# Patient Record
Sex: Male | Born: 1980 | Race: Black or African American | Hispanic: No | Marital: Single | Smoking: Current every day smoker
Health system: Southern US, Community
[De-identification: ages and names within clinical notes are randomized; demographics above are authoritative.]

---

## 2008-09-27 ENCOUNTER — Emergency Department (HOSPITAL_COMMUNITY): Admission: EM | Admit: 2008-09-27 | Discharge: 2008-09-27 | Payer: Self-pay | Admitting: Family Medicine

## 2014-04-22 ENCOUNTER — Encounter (HOSPITAL_COMMUNITY): Payer: Self-pay | Admitting: *Deleted

## 2014-04-22 ENCOUNTER — Emergency Department (HOSPITAL_COMMUNITY)
Admission: EM | Admit: 2014-04-22 | Discharge: 2014-04-22 | Disposition: A | Discharge status: Home / Self Care | Payer: Self-pay | Attending: Emergency Medicine | Admitting: Emergency Medicine

## 2014-04-22 ENCOUNTER — Emergency Department (HOSPITAL_COMMUNITY): Payer: Self-pay

## 2014-04-22 DIAGNOSIS — S82435A Nondisplaced oblique fracture of shaft of left fibula, initial encounter for closed fracture: Secondary | ICD-10-CM | POA: Insufficient documentation

## 2014-04-22 DIAGNOSIS — M25572 Pain in left ankle and joints of left foot: Secondary | ICD-10-CM

## 2014-04-22 DIAGNOSIS — S99912A Unspecified injury of left ankle, initial encounter: Secondary | ICD-10-CM

## 2014-04-22 DIAGNOSIS — Y9389 Activity, other specified: Secondary | ICD-10-CM | POA: Insufficient documentation

## 2014-04-22 DIAGNOSIS — Y998 Other external cause status: Secondary | ICD-10-CM | POA: Insufficient documentation

## 2014-04-22 DIAGNOSIS — Y9289 Other specified places as the place of occurrence of the external cause: Secondary | ICD-10-CM | POA: Insufficient documentation

## 2014-04-22 DIAGNOSIS — Z720 Tobacco use: Secondary | ICD-10-CM | POA: Insufficient documentation

## 2014-04-22 DIAGNOSIS — W1849XA Other slipping, tripping and stumbling without falling, initial encounter: Secondary | ICD-10-CM | POA: Insufficient documentation

## 2014-04-22 DIAGNOSIS — S82402A Unspecified fracture of shaft of left fibula, initial encounter for closed fracture: Secondary | ICD-10-CM

## 2014-04-22 DIAGNOSIS — M25562 Pain in left knee: Secondary | ICD-10-CM

## 2014-04-22 DIAGNOSIS — M25472 Effusion, left ankle: Secondary | ICD-10-CM

## 2014-04-22 DIAGNOSIS — W19XXXA Unspecified fall, initial encounter: Secondary | ICD-10-CM

## 2014-04-22 MED ORDER — OXYCODONE-ACETAMINOPHEN 5-325 MG PO TABS
2.0000 | ORAL_TABLET | Freq: Once | ORAL | Status: AC
Start: 2014-04-22 — End: 2014-04-22
  Administered 2014-04-22: 2 via ORAL
  Filled 2014-04-22: qty 2

## 2014-04-22 MED ORDER — OXYCODONE-ACETAMINOPHEN 10-325 MG PO TABS: 1.0000 | ORAL_TABLET | ORAL | Status: AC | PRN
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
## 2014-04-22 NOTE — Discharge Instructions (Signed)
1. Medications: peercocet, usual home medications 2. Treatment: rest, drink plenty of fluids, wear brace, no walking on your left leg 3. Follow Up: Please followup with orthopedics within 3 days for further evaluation of your leg; the resource guide to find a primary care physician do not have one   Fibular Fracture, Ankle, Adult, Treated With or Without Immobilization A fibular fracture at your ankle is a break (fracture) bone in the smallest of the two bones in your lower leg, located on the outside of your leg (fibula) close to the area at your ankle joint. CAUSES  Rolling your ankle.  Twisting your ankle.  Extreme flexing or extending of your foot.  Severe force on your ankle as when falling from a distance. RISK FACTORS  Jumping activities.  Participation in sports.  Osteoporosis.  Advanced age.  Previous ankle injuries. SIGNS AND SYMPTOMS  Pain.  Swelling.  Inability to put weight on injured ankle.  Bruising.  Bone deformities at site of injury. DIAGNOSIS  This fracture is diagnosed with the help of an X-ray exam. TREATMENT  If the fractured bone did not move out of place it usually will heal without problems and does casting or splinting. If immobilization is needed for comfort or the fractured bone moved out of place and will not heal properly with immobilization, a cast or splint will be used. HOME CARE INSTRUCTIONS   Apply ice to the area of injury:  Put ice in a plastic bag.  Place a towel between your skin and the bag.  Leave the ice on for 20 minutes, 2-3 times a day.  Use crutches as directed. Resume walking without crutches as directed by your health care provider.  Only take over-the-counter or prescription medicines for pain, discomfort, or fever as directed by your health care provider.  If you have a removable splint or boot, do not remove the boot unless directed by your health care provider. SEEK MEDICAL CARE IF:   You have continued  pain or more swelling  The medications do not control the pain. SEEK IMMEDIATE MEDICAL CARE IF:  You develop severe pain in the leg or foot.  Your skin or nails below the injury turn blue or grey or feel cold or numb. MAKE SURE YOU:   Understand these instructions.  Will watch your condition.  Will get help right away if you are not doing well or get worse. Document Released: 05/23/2005 Document Revised: 03/13/2013 Document Reviewed: 01/02/2013 [facility] Patient Information 2015 [employer], [place]. This information is not intended to replace advice given to you by your health care provider. Make sure you discuss any questions you have with your health care provider.    Emergency Department Resource Guide 1) Find a Doctor and Pay Out of Pocket Although you won't have to find out who is covered by your insurance plan, it is a good idea to ask around and get recommendations. You will then need to call the office and see if the doctor you have chosen will accept you as a new patient and what types of options they offer for patients who are self-pay. Some doctors offer discounts or will set up payment plans for their patients who do not have insurance, but you will need to ask so you aren't surprised when you get to your appointment.  2) Contact Your Local Health Department Not all health departments have doctors that can see patients for sick visits, but many do, so it is worth a call to see if yours  does. If you don't know where your local health department is, you can check in your phone book. The CDC also has a tool to help you locate your state's health department, and many state websites also have listings of all of their local health departments.  3) Find a Walk-in Clinic If your illness is not likely to be very severe or complicated, you may want to try a walk in clinic. These are popping up all over the country in pharmacies, drugstores, and shopping centers. They're usually staffed by  nurse practitioners or physician assistants that have been trained to treat common illnesses and complaints. They're usually fairly quick and inexpensive. However, if you have serious medical issues or chronic medical problems, these are probably not your best option.  No Primary Care Doctor: - Call Health Connect at  [phone] - they can help you locate a primary care doctor that  accepts your insurance, provides certain services, etc. - Physician Referral Service- [phone]  Chronic Pain Problems: Organization         Address  Phone   Notes  [facility]  [phone] Patients need to be referred by their primary care doctor.   Medication Assistance: Organization         Address  Phone   Notes  [facility] [address] [place], [place] [address] [phone] --Must be a resident of [facility] -- Must have NO insurance coverage whatsoever (no Medicaid/ Medicare, etc.) -- The pt. MUST have a primary care doctor that directs their care regularly and follows them in the community   [facility]  [phone]   [employer]  [phone]    Agencies that provide inexpensive medical care: Organization         Address  Phone   Notes  [facility]  [phone]   [facility]    [phone]   [facility] [address] [place], [place] [address] [phone]   [facility] [address] [place] [place] [phone]   Planned Parenthood    [phone]   [facility]    [phone]   Community Health and [facility]  [address], [place] Phone:  [phone], Fax:  [phone] Hours of Operation:  9 am - 6 pm, M-F.  Also accepts Medicaid/Medicare and self-pay.  [facility]  [address], [place] Phone: [phone], Fax: [phone]. Hours of Operation:  8:30  am - 5:30 pm, M-F.  Also accepts Medicaid and self-pay.  [facility] [place] [address] [place] Phone: [phone]   [facility] [address] Natasha Bence [place], [place] [phone], Ext. [phone] Mondays & Thursdays: 7-9 AM.  First 15 patients are seen on a first come, first serve basis.    Medicaid-accepting [facility] Providers:  Organization         Address  Phone   Notes  [facility] [address], [place] [phone] Also accepts self-pay patients.  [facility] [address] Laurell Josephs [place], [place]  [phone]   [facility] [address], [place] [phone]   [facility] [address] [place] [phone]   Renaye Rakers [address], [place]   [phone]  Only accepts [place] Access Medicaid patients after they have their name applied to their card.   Self-Pay (no insurance) in [facility]:  Organization         Address  Phone   Notes  Sickle Cell Patients, [facility] [address], [place] [phone]   [facility] [address], [place] [phone]   [facility] [place]  [address], [place] [phone]) [phone]   [facility]/Dr. Osei-Bonsu  [address] [place] [address], [place] or [phone], [place] [phone] Phone number for both [place] and [place] locations is the same.  [facility] [address], [place] [phone](336) [phone]   [facility] [place] [address] [place] [address], [place] or [address] [phone]) [phone]) [phone]   [facility] [address], [place] [phone]) [phone], phone; [phone], fax Sees patients 1st and 3rd Saturday of every month.  Must not qualify for public or private insurance (i.e. Medicaid, Medicare, [place] Health  Choice, Veterans' Benefits)  Household income should be no more than 200% of the poverty level The clinic cannot treat you if you are pregnant or think you are pregnant  Sexually transmitted diseases are not treated at the clinic.    Dental Care: Organization         Address  Phone  Notes  [facility] of [facility] [address], [place] [phone] Accepts children up to age 35 who are enrolled in [place] or [place] Health Choice; pregnant women with a Medicaid card; and children who have applied for Medicaid or [place] Health Choice, but were declined, whose parents can pay a reduced fee at time of service.  [facility] of [facility] [place]  [address], [place] [phone]) [phone] Accepts children up to age 35 who are enrolled in [place] or [place] Health Choice; pregnant women with a Medicaid card; and children who have applied for Medicaid or [place] Health Choice, but were declined, whose parents can pay a reduced fee at time of service.  [facility]  [address], [place] [phone]) [phone] Patients are seen by appointment only. Walk-ins are not accepted. [facility] will see patients 518 years of age and older. Monday - Tuesday (8am-5pm) Most Wednesdays (8:30-5pm) $30 per visit, cash only  [facility]  [address], [facility] [phone]) [phone] Patients are seen by appointment only. Walk-ins are not accepted. [facility] will see patients 35 years of age and older. One Wednesday Evening (Monthly: Volunteer Based).  $30 per visit, cash only  [facility]  [phone] for adults; Children under age 264, call Graduate Pediatric Dentistry at [phone]) [phone]. Children aged 144-14, please call [phone] to request a pediatric application.  Dental services are provided in all areas of dental care including fillings, crowns and bridges, complete  and partial dentures, implants, gum treatment, root canals, and extractions. Preventive care is also provided. Treatment is provided to both adults and children. Patients are selected via a lottery and there is often a waiting list.   [facility] [address], [place]  [phone] [email or web address]   [facility] [address], [place], [place] [phone], Ext. [phone] Second and Fourth Thursday of each month, opens at 6:30 AM; Clinic  ends at 9 AM.  Patients are seen on a first-come first-served basis, and a limited number are seen during each clinic.   [facility]  [address] Ether Griffins [place], [place] [phone]   Eligibility Requirements You must have lived in [place], [place], or [place] counties for at least the last three months.   You cannot be eligible for state or federal sponsored [place], including [employer], [place], or [employer].   You generally cannot be eligible for healthcare insurance through your employer.    How to apply: Eligibility screenings are held every Tuesday and Wednesday afternoon from 1:00 pm until 4:00 pm. You do not need an appointment for the interview!  [facility] [address] [place], [place] [phone]   [facility] Health Department  [phone]   [facility] Health Department  [phone]   [facility] Health Department  [phone]    Behavioral Health Resources in the Community: Intensive Outpatient Programs Organization         Address  Phone  Notes  [facility] [address] [place], [place] [phone]   [facility] [address] [place], [place] [phone]   [facility]: [facility] [address], [place], [place]  [phone]   [facility] Mental Health [address],  [place], [place] [phone] or [phone]   Substance Abuse Resources Organization          Address  Phone  Notes  Alcohol [facility] Drug Services  [phone]   [facility]  [phone]   [facility]  [phone]   Floydene Flock  [phone]   Residential & Outpatient Substance Abuse Program  [phone]   Psychological Services Organization         Address  Phone  Notes  [facility] Health  [phone]   [facility] Services  [phone]   [facility] Mental Health [address] [place] [phone] or [phone]    Mobile Crisis Teams Organization         Address  Phone  Notes  [facility], Mobile Crisis Care Unit  [phone]   [facility]  [address] [place], [place] [phone]   Doristine Locks [address] [place] [phone]    Self-Help/Support Groups Organization         Address  Phone             Notes  [facility] [place] - variety of support groups  336- I7437963 Call for more information  Narcotics Anonymous (NA), [facility] [address], [employer] [place]  2 meetings at this location   Statistician         Address  Phone  Notes  [facility] [address],    [place]  [phone]   [facility]  [address], [place] [address], [place], [place] [phone]   [facility] [address] [place], [place] [phone] Admissions: 8am-3pm M-F  [facility] [address]    [place], [place] [phone]   [facility] [address] Starling Manns [place], [place] [phone]   [facility] [address]  [place], [place] [phone]   [facility] - Intensive Outpatient [address] Laurell Josephs [address], [place], [place] [phone]   [facility] [address] Henderson Cloud  [place], [place] [phone] or [phone]   [facility]idential  Treatment Services (RTS) [address], [place], [place]  [phone] Accepts Medicaid  [facility] [address],  [place] [phone] Substance Abuse/Addiction Treatment   [facility] Organization         Address  Phone  Notes  [facility]  [phone]   Angie Fava, PhD [address] Ervin Knack [place], [place]   [phone] or [phone]   [facility] Behavioral   [address] [place], [place] [phone]   [facility] [address] [place], [place] [phone] Insurance/Medicaid/sponsorship through [facility] and Families [address]                                    [place], [place] [phone] Therapy/tele-psych/case  [facility] [address], [place] [phone]    Dr. Lolly Mustache  [phone]   [facility]  [employer] [facility] Dept. 1) [address] [place] 2) [address] [place] 3)  [address], [place] [phone]  [phone]   [facility] Child Abuse Hotline 2542073668 or [phone] (After Hours)

## 2017-03-02 ENCOUNTER — Encounter (HOSPITAL_COMMUNITY): Payer: Self-pay

## 2017-03-02 ENCOUNTER — Telehealth: Payer: Self-pay | Admitting: *Deleted

## 2017-03-02 ENCOUNTER — Emergency Department (HOSPITAL_COMMUNITY)
Admission: EM | Admit: 2017-03-02 | Discharge: 2017-03-02 | Disposition: A | Discharge status: Home / Self Care | Payer: Self-pay | Attending: Emergency Medicine | Admitting: Emergency Medicine

## 2017-03-02 DIAGNOSIS — L0291 Cutaneous abscess, unspecified: Secondary | ICD-10-CM

## 2017-03-02 DIAGNOSIS — F17210 Nicotine dependence, cigarettes, uncomplicated: Secondary | ICD-10-CM | POA: Insufficient documentation

## 2017-03-02 DIAGNOSIS — Z79899 Other long term (current) drug therapy: Secondary | ICD-10-CM | POA: Insufficient documentation

## 2017-03-02 DIAGNOSIS — L02214 Cutaneous abscess of groin: Secondary | ICD-10-CM | POA: Insufficient documentation

## 2017-03-02 MED ORDER — LIDOCAINE HCL (PF) 1 % IJ SOLN
5.0000 mL | Freq: Once | INTRAMUSCULAR | Status: AC
  Administered 2017-03-02: 5 mL via INTRADERMAL
  Filled 2017-03-02: qty 5

## 2017-03-02 MED ORDER — SULFAMETHOXAZOLE-TRIMETHOPRIM 800-160 MG PO TABS: 1.0000 | ORAL_TABLET | Freq: Two times a day (BID) | ORAL | 0 refills | Status: AC

## 2017-03-02 NOTE — ED Provider Notes (Signed)
[facility] DEPT Provider Note   CSN: [id number] Arrival date & time: 03/02/17  0825     History   Chief Complaint Chief Complaint  Patient presents with  . Abscess    HPI Justin Obrien is a 36 y.o. male.  The history is provided by the patient and medical records.  Abscess     36 y.o. M here with abscess of his perineal area.  States he first noticed this yesterday and tried to pop it himself at home and thinks he made it worse.  He did get a little bit of yellow looking pus out.  No fever, chills.  He denies testicle pain/swelling.  No recent fevers or chills.  No hx of similar symptoms in the past.  No urinary troubles.  History reviewed. No pertinent past medical history.  There are no active problems to display for this patient.   History reviewed. No pertinent surgical history.     Home Medications    Prior to Admission medications   Medication Sig Start Date End Date Taking? Authorizing Provider  oxyCODONE-acetaminophen (PERCOCET) 10-325 MG per tablet Take 1 tablet by mouth every 4 (four) hours as needed for pain. 04/22/14   Muthersbaugh, Dahlia Client, PA-C    Family History No family history on file.  Social History Social History  Substance Use Topics  . Smoking status: Current Every Day Smoker  . Smokeless tobacco: Not on file  . Alcohol use Yes     Allergies   Patient has no known allergies.   Review of Systems Review of Systems  Skin:       abscess  All other systems reviewed and are negative.    Physical Exam Updated Vital Signs BP 133/86   Pulse 86   Temp (!) 97.2 F (36.2 C)   Resp 18   Wt 117.9 kg (260 lb)   SpO2 100%   BMI 34.30 kg/m   Physical Exam  Constitutional: He is oriented to person, place, and time. He appears well-developed and well-nourished.  HENT:  Head: Normocephalic and atraumatic.  Mouth/Throat: Oropharynx is clear and moist.  Eyes: Pupils are equal, round, and reactive to light. Conjunctivae and EOM are  normal.  Neck: Normal range of motion.  Cardiovascular: Normal rate, regular rhythm and normal heart sounds.   Pulmonary/Chest: Effort normal and breath sounds normal. No respiratory distress. He has no wheezes.  Abdominal: Soft. Bowel sounds are normal. There is no tenderness. There is no rebound.  Genitourinary: Right testis shows no tenderness. Left testis shows no tenderness. No discharge found.  Genitourinary Comments: 2 small, well circumscribed abscessed appearing areas of the perineum with scabs overlying, no significant fluctuance, there very mild localized erythema and skin irritation without extension into the groin or scrotum; no scrotal swelling or tenderness; no tissue crepitus, no discharge  Musculoskeletal: Normal range of motion.  Neurological: He is alert and oriented to person, place, and time.  Skin: Skin is warm and dry.  Psychiatric: He has a normal mood and affect.  Nursing note and vitals reviewed.    ED Treatments / Results  Labs (all labs ordered are listed, but only abnormal results are displayed) Labs Reviewed - No data to display  EKG  EKG Interpretation None       Radiology No results found.  Procedures Procedures (including critical care time)  INCISION AND DRAINAGE Performed by: Garlon Hatchet Consent: Verbal consent obtained. Risks and benefits: risks, benefits and alternatives were discussed Type: abscess  Body area: perineum  Anesthesia: local infiltration  Incision was made with a scalpel.  Local anesthetic: lidocaine 1% without epinephrine  Anesthetic total: 3 ml  Complexity: complex Blunt dissection to break up loculations  Drainage: purulent, bloody  Drainage amount: small  Packing material: none  Patient tolerance: Patient tolerated the procedure well with no immediate complications.     Medications Ordered in ED Medications - No data to display   Initial Impression / Assessment and Plan / ED Course  I have  reviewed the triage vital signs and the nursing notes.  Pertinent labs & imaging results that were available during my care of the patient were reviewed by me and considered in my medical decision making (see chart for details).  36 y.o. M here with abscess of his perineum.  States he noticed this yesterday and tried to "pop it" himself but thinks he made it worse. Patient is afebrile and nontoxic. On exam he does have what appears to be abscess formation of the perennial area. There is scabbing noted which I suspect is secondary to trying manipulate it himself yesterday. He has no testicle pain or swelling. No cellulitic changes of the genital region. No tissue crepitus.  Do not clinically suspect Fournier's gangrene.  Do not feel that he needs scrotal US at this time given no testicle symptoms.  After discussion with attending physician, Dr. Jeraldine Loots-- I&D was performed, small amount of purulent/bloody drainage.  Will start on abx, warm compresses.  Given urology follow-up.  Discussed plan with patient, he/she acknowledged understanding and agreed with plan of care.  Return precautions given for new or worsening symptoms.  Final Clinical Impressions(s) / ED Diagnoses   Final diagnoses:  Abscess    New Prescriptions Discharge Medication List as of 03/02/2017 11:13 AM    START taking these medications   Details  sulfamethoxazole-trimethoprim (BACTRIM DS,SEPTRA DS) 800-160 MG tablet Take 1 tablet by mouth 2 (two) times daily., Starting Thu 03/02/2017, Until Sun 03/12/2017, Print         Garlon Hatchet, PA-C 03/02/17 1331    Gerhard Munch, MD 03/02/17 1729

## 2017-03-02 NOTE — ED Triage Notes (Signed)
Pt presents to the ed with complaints of an abscess in between in groin and scrotum that is red and swollen, first noticing it yesterday. States that there was some drainage yesterday but it has stopped and is still irritating him.

## 2017-03-02 NOTE — Telephone Encounter (Signed)
Pt called for pain Rx related to recent ED visit.  EDCM advised that pain Rx can not be called in, that he may take OTC pain medication, or if desired something stronger, he would need to visit his PCP or urgent care.

## 2017-03-02 NOTE — ED Notes (Signed)
I & D tray set up at bedside if needed. 

## 2017-03-02 NOTE — Discharge Instructions (Signed)
Take the prescribed medication as directed.  Recommend warm compresses or warm soaks at home like we talked about. Follow-up with urology if any worsening symptoms-- call their office for appt. Return to the ED for new or worsening symptoms.

## 2018-09-29 ENCOUNTER — Emergency Department (HOSPITAL_COMMUNITY): Payer: No Typology Code available for payment source

## 2018-09-29 ENCOUNTER — Encounter (HOSPITAL_COMMUNITY): Payer: Self-pay | Admitting: *Deleted

## 2018-09-29 ENCOUNTER — Other Ambulatory Visit: Payer: Self-pay

## 2018-09-29 ENCOUNTER — Emergency Department (HOSPITAL_COMMUNITY)
Admission: EM | Admit: 2018-09-29 | Discharge: 2018-09-29 | Disposition: A | Discharge status: Home / Self Care | Payer: No Typology Code available for payment source | Attending: Emergency Medicine | Admitting: Emergency Medicine

## 2018-09-29 DIAGNOSIS — Y92410 Unspecified street and highway as the place of occurrence of the external cause: Secondary | ICD-10-CM | POA: Diagnosis not present

## 2018-09-29 DIAGNOSIS — S7001XA Contusion of right hip, initial encounter: Secondary | ICD-10-CM | POA: Insufficient documentation

## 2018-09-29 DIAGNOSIS — F17210 Nicotine dependence, cigarettes, uncomplicated: Secondary | ICD-10-CM | POA: Diagnosis not present

## 2018-09-29 DIAGNOSIS — Y999 Unspecified external cause status: Secondary | ICD-10-CM | POA: Insufficient documentation

## 2018-09-29 DIAGNOSIS — R079 Chest pain, unspecified: Secondary | ICD-10-CM | POA: Insufficient documentation

## 2018-09-29 DIAGNOSIS — S40011A Contusion of right shoulder, initial encounter: Secondary | ICD-10-CM | POA: Diagnosis not present

## 2018-09-29 DIAGNOSIS — S40911A Unspecified superficial injury of right shoulder, initial encounter: Secondary | ICD-10-CM | POA: Diagnosis present

## 2018-09-29 DIAGNOSIS — Y939 Activity, unspecified: Secondary | ICD-10-CM | POA: Diagnosis not present

## 2018-09-29 MED ORDER — TRAMADOL HCL 50 MG PO TABS: 50.0000 mg | ORAL_TABLET | Freq: Four times a day (QID) | ORAL | 0 refills | Status: AC | PRN
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
## 2018-09-29 NOTE — ED Notes (Signed)
Bed: WTR8 Expected date:  Expected time:  Means of arrival:  Comments: 

## 2018-09-29 NOTE — Discharge Instructions (Addendum)
Ibuprofen 600 mg every 6 hours as needed for pain. ° °Tramadol as prescribed as needed for pain not relieved with ibuprofen. ° °Follow-up with your primary doctor if symptoms or not improving in the next week. °

## 2019-07-29 ENCOUNTER — Encounter (HOSPITAL_COMMUNITY): Payer: Self-pay

## 2019-07-29 ENCOUNTER — Other Ambulatory Visit: Payer: Self-pay

## 2019-07-29 ENCOUNTER — Ambulatory Visit (HOSPITAL_COMMUNITY)
Admission: EM | Admit: 2019-07-29 | Discharge: 2019-07-29 | Disposition: A | Discharge status: Home / Self Care | Payer: Self-pay | Attending: Family Medicine | Admitting: Family Medicine

## 2019-07-29 DIAGNOSIS — N4889 Other specified disorders of penis: Secondary | ICD-10-CM

## 2019-07-29 DIAGNOSIS — N481 Balanitis: Secondary | ICD-10-CM

## 2019-07-29 DIAGNOSIS — N471 Phimosis: Secondary | ICD-10-CM

## 2019-07-29 MED ORDER — NAPROXEN 500 MG PO TABS: 500.0000 mg | ORAL_TABLET | Freq: Two times a day (BID) | ORAL | 0 refills | Status: AC

## 2019-07-29 MED ORDER — HYDROCORTISONE 1 % EX CREA: TOPICAL_CREAM | CUTANEOUS | 0 refills | Status: AC

## 2019-07-29 MED ORDER — FLUCONAZOLE 150 MG PO TABS: 150.0000 mg | ORAL_TABLET | ORAL | 0 refills | Status: AC

## 2019-07-29 NOTE — ED Triage Notes (Signed)
Pt C/o his penis head bruising due to having to pull back the foreskin from penis. Pt states that there are scratches and burning sensation from pulling the skin back

## 2019-07-29 NOTE — ED Provider Notes (Signed)
  [facility]   MRN: [id number] DOB: Oct 06, 1980  Subjective:   Davontay Watlington is a 39 y.o. male presenting for 2 week history of persistent penile pain, inability to retract foreskin, swelling of penis. Has sex with his wife only, is in a monogamous relationship (refuses STI testing). Denies hx of diabetes. Has applied otc creams without relief.   No current facility-administered medications for this encounter.  Current Outpatient Medications:  .  oxyCODONE-acetaminophen (PERCOCET) 10-325 MG per tablet, Take 1 tablet by mouth every 4 (four) hours as needed for pain., Disp: 30 tablet, Rfl: 0 .  traMADol (ULTRAM) 50 MG tablet, Take 1 tablet (50 mg total) by mouth every 6 (six) hours as needed., Disp: 10 tablet, Rfl: 0   No Known Allergies  History reviewed. No pertinent past medical history.   History reviewed. No pertinent surgical history.  Family History  Family history unknown: Yes    Social History   Tobacco Use  . Smoking status: Current Every Day Smoker  . Smokeless tobacco: Never Used  Substance Use Topics  . Alcohol use: Yes  . Drug use: Yes    Types: Marijuana    ROS Denies fever, dysuria, hematuria, n/v, abdomina/pelvic pain.   Objective:   Vitals: BP (!) 132/92 (BP Location: Right Arm)   Pulse (!) 103   Temp 98.1 F (36.7 C) (Oral)   Resp 20   SpO2 98%   Physical Exam Constitutional:      General: He is not in acute distress.    Appearance: Normal appearance. He is well-developed and normal weight. He is not ill-appearing, toxic-appearing or diaphoretic.  HENT:     Head: Normocephalic and atraumatic.     Right Ear: External ear normal.     Left Ear: External ear normal.     Nose: Nose normal.     Mouth/Throat:     Pharynx: Oropharynx is clear.  Eyes:     General: No scleral icterus.       Right eye: No discharge.        Left eye: No discharge.     Extraocular Movements: Extraocular movements intact.     Pupils: Pupils are equal,  round, and reactive to light.  Cardiovascular:     Rate and Rhythm: Normal rate.  Pulmonary:     Effort: Pulmonary effort is normal.  Genitourinary:    Penis: Uncircumcised. Phimosis, tenderness (fissures along distal portion of foreskin), discharge and swelling present. No paraphimosis or lesions.   Musculoskeletal:     Cervical back: Normal range of motion.  Neurological:     Mental Status: He is alert and oriented to person, place, and time.  Psychiatric:        Mood and Affect: Mood normal.        Behavior: Behavior normal.        Thought Content: Thought content normal.        Judgment: Judgment normal.      Assessment and Plan :   1. Phimosis of penis   2. Penile pain   3. Balanitis     Will have patient take oral diflucan, topical hydrocortisone. Use naproxen for pain and inflammation. Follow up with urology. Counseled patient on potential for adverse effects with medications prescribed/recommended today, ER and return-to-clinic precautions discussed, patient verbalized understanding.    Wallis Bamberg, [place] 07/29/19 1645
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
